# Patient Record
Sex: Female | Born: 1970 | Race: Black or African American | Hispanic: No | Marital: Married | Smoking: Former smoker
Health system: Southern US, Community
[De-identification: ages and names within clinical notes are randomized; demographics above are authoritative.]

## PROBLEM LIST (undated history)

## (undated) DIAGNOSIS — D649 Anemia, unspecified: Secondary | ICD-10-CM

## (undated) DIAGNOSIS — Z5189 Encounter for other specified aftercare: Secondary | ICD-10-CM

## (undated) DIAGNOSIS — K219 Gastro-esophageal reflux disease without esophagitis: Secondary | ICD-10-CM

## (undated) DIAGNOSIS — E785 Hyperlipidemia, unspecified: Secondary | ICD-10-CM

## (undated) DIAGNOSIS — E079 Disorder of thyroid, unspecified: Secondary | ICD-10-CM

## (undated) DIAGNOSIS — E119 Type 2 diabetes mellitus without complications: Secondary | ICD-10-CM

## (undated) DIAGNOSIS — T7840XA Allergy, unspecified, initial encounter: Secondary | ICD-10-CM

## (undated) DIAGNOSIS — J45909 Unspecified asthma, uncomplicated: Secondary | ICD-10-CM

## (undated) DIAGNOSIS — N189 Chronic kidney disease, unspecified: Secondary | ICD-10-CM

## (undated) HISTORY — PX: GALLBLADDER SURGERY: SHX652

## (undated) HISTORY — DX: Encounter for other specified aftercare: Z51.89

## (undated) HISTORY — DX: Disorder of thyroid, unspecified: E07.9

## (undated) HISTORY — DX: Unspecified asthma, uncomplicated: J45.909

## (undated) HISTORY — DX: Gastro-esophageal reflux disease without esophagitis: K21.9

## (undated) HISTORY — PX: HERNIA REPAIR: SHX51

## (undated) HISTORY — DX: Allergy, unspecified, initial encounter: T78.40XA

## (undated) HISTORY — DX: Hyperlipidemia, unspecified: E78.5

## (undated) HISTORY — DX: Chronic kidney disease, unspecified: N18.9

## (undated) HISTORY — PX: FRACTURE SURGERY: SHX138

## (undated) HISTORY — PX: ABDOMINAL HYSTERECTOMY: SHX81

## (undated) HISTORY — DX: Anemia, unspecified: D64.9

## (undated) HISTORY — DX: Type 2 diabetes mellitus without complications: E11.9

---

## 2018-09-27 ENCOUNTER — Ambulatory Visit: Payer: Self-pay

## 2018-09-27 NOTE — Telephone Encounter (Signed)
Patient called in with c/o "rectal bleeding, abdominal pain." She says "I have a history of polyps and I missed my last Colonoscopy because of my job. I had blood mixed in my stool and dripped in the water a couple of days ago, mixed with small blood clots. It wasn't a lot, maybe 50 cc's. I have constipation, sometimes going every 3-4 day and as long as 6 days." I asked about other symptoms, she says "abdominal pain since the blood and I was dizzy 4-5 days ago before the bleeding started." According to protocol, see PCP within 3 days, no availability with PCP, appointment scheduled for Thursday, 09/30/18 at 1020 with Dr. Alvy BimlerSagardia, care advice given, patient verbalized understanding.  Reason for Disposition . MILD rectal bleeding (more than just a few drops or streaks)  Answer Assessment - Initial Assessment Questions 1. APPEARANCE of BLOOD: "What color is it?" "Is it passed separately, on the surface of the stool, or mixed in with the stool?"      Mixed in the stool and dripped in the toilet, bright red 2. AMOUNT: "How much blood was passed?"      Small clots, about 50 cc's including the clots 3. FREQUENCY: "How many times has blood been passed with the stools?"      Once a couple of days ago 4. ONSET: "When was the blood first seen in the stools?" (Days or weeks)      A couple of days ago 5. DIARRHEA: "Is there also some diarrhea?" If so, ask: "How many diarrhea stools were passed in past 24 hours?"      No 6. CONSTIPATION: "Do you have constipation?" If so, "How bad is it?"     Yes; go every 3-4 days, as long as 6 days.  7. RECURRENT SYMPTOMS: "Have you had blood in your stools before?" If so, ask: "When was the last time?" and "What happened that time?"      Yes when I was 47 years old; found polyps and adenoma. Since then, a few polyps are found with the colonoscopy 8. BLOOD THINNERS: "Do you take any blood thinners?" (e.g., Coumadin/warfarin, Pradaxa/dabigatran, aspirin)     No 9. OTHER  SYMPTOMS: "Do you have any other symptoms?"  (e.g., abdominal pain, vomiting, dizziness, fever)     Abdominal pain, dizziness 4-5 days ago before the bleeding started 10. PREGNANCY: "Is there any chance you are pregnant?" "When was your last menstrual period?"       No; hysterectomy  Protocols used: RECTAL BLEEDING-A-AH

## 2018-09-30 ENCOUNTER — Ambulatory Visit (INDEPENDENT_AMBULATORY_CARE_PROVIDER_SITE_OTHER): Payer: Managed Care, Other (non HMO) | Admitting: Emergency Medicine

## 2018-09-30 ENCOUNTER — Encounter: Payer: Self-pay | Admitting: Emergency Medicine

## 2018-09-30 VITALS — BP 132/82 | HR 77 | Temp 98.1°F | Resp 16 | Ht 60.0 in | Wt 189.0 lb

## 2018-09-30 DIAGNOSIS — Z86010 Personal history of colonic polyps: Secondary | ICD-10-CM

## 2018-09-30 DIAGNOSIS — K625 Hemorrhage of anus and rectum: Secondary | ICD-10-CM

## 2018-09-30 LAB — POCT CBC
GRANULOCYTE PERCENT: 63.5 % (ref 37–80)
HCT, POC: 39.1 % (ref 29–41)
Hemoglobin: 13.4 g/dL (ref 11–14.6)
Lymph, poc: 3 (ref 0.6–3.4)
MCH, POC: 27.9 pg (ref 27–31.2)
MCHC: 34.2 g/dL (ref 31.8–35.4)
MCV: 81.5 fL (ref 76–111)
MID (cbc): 0.8 (ref 0–0.9)
MPV: 7.6 fL (ref 0–99.8)
POC Granulocyte: 6.7 (ref 2–6.9)
POC LYMPH PERCENT: 29 %L (ref 10–50)
POC MID %: 7.5 %M (ref 0–12)
Platelet Count, POC: 355 10*3/uL (ref 142–424)
RBC: 4.8 M/uL (ref 4.04–5.48)
RDW, POC: 13 %
WBC: 10.5 10*3/uL — AB (ref 4.6–10.2)

## 2018-09-30 NOTE — Assessment & Plan Note (Signed)
Clinically stable.  Normal vital signs with no orthostatic changes.  Normal CBC with normal hemoglobin.  Chronic history of intermittent rectal bleeding with a history of colonic polyps.  Needs GI evaluation with possible colonoscopy.  Advised to go to the emergency room if symptoms worsen in the next several days.

## 2018-09-30 NOTE — Patient Instructions (Addendum)
     If you have lab work done today you will be contacted with your lab results within the next 2 weeks.  If you have not heard from us then please contact us. The fastest way to get your results is to register for My Chart.   IF you received an x-ray today, you will receive an invoice from [facility]. Please contact [facility] at [phone] with questions or concerns regarding your invoice.   IF you received labwork today, you will receive an invoice from [facility]. Please contact [facility] at [phone] with questions or concerns regarding your invoice.   Our billing staff will not be able to assist you with questions regarding bills from these companies.  You will be contacted with the lab results as soon as they are available. The fastest way to get your results is to activate your My Chart account. Instructions are located on the last page of this paperwork. If you have not heard from us regarding the results in 2 weeks, please contact this office.     Rectal Bleeding  Rectal bleeding is when blood comes out of the opening of the butt (anus). People with this kind of bleeding may notice bright red blood in their underwear or in the toilet after they poop (have a bowel movement). They may also have dark red or black poop (stool). Rectal bleeding is often a sign that something is wrong. It needs to be checked by a doctor. Follow these instructions at home: Watch for any changes in your condition. Take these actions to help with bleeding and discomfort:  Eat a diet that is high in fiber. This will keep your poop soft so it is easier for you to poop without pushing too hard. Ask your doctor to tell you what foods and drinks are high in fiber.  Drink enough fluid to keep your pee (urine) clear or pale yellow. This also helps keep your poop soft.  Try taking a warm bath. This may help with pain.  Keep all follow-up visits as told by your doctor. This is  important. Get help right away if:  You have new bleeding.  You have more bleeding than before.  You have black or dark red poop.  You throw up (vomit) blood or something that looks like coffee grounds.  You have pain or tenderness in your belly (abdomen).  You have a fever.  You feel weak.  You feel sick to your stomach (nauseous).  You pass out (faint).  You have very bad pain in your butt.  You cannot poop. This information is not intended to replace advice given to you by your health care provider. Make sure you discuss any questions you have with your health care provider. Document Released: 06/11/2011 Document Revised: 03/06/2016 Document Reviewed: 11/25/2015 Elsevier Interactive Patient Education  2019 Elsevier Inc.  

## 2018-09-30 NOTE — Progress Notes (Signed)
Kelly Ellis 47 y.o.   Chief Complaint  Patient presents with  . Blood In Stools    x 3 days/ hx of polyps  . Nausea    x 3 days    HISTORY OF PRESENT ILLNESS: This is a 47 y.o. female with history of colonic polyps and chronic intermittent lower GI bleed complaining of typical rectal bleeding with every bowel movement for the past 5 days.  No syncopal episodes but had one dizzy spell yesterday.  Some diffuse lower abdominal pain.  Mild nausea but no vomiting.  Started having regular colonoscopies at age 82 when her polyps were first discovered.  One polyp was precancerous.  Last colonoscopy 7 years ago.  No other significant symptoms.  HPI   Prior to Admission medications   Medication Sig Start Date End Date Taking? Authorizing Provider  amitriptyline (ELAVIL) 25 MG tablet Take 25 mg by mouth at bedtime.   Yes [provider]  Butalbital-APAP-Caffeine (FIORICET PO) Take by mouth as needed.   Yes [provider]  cetirizine (ZYRTEC) 10 MG tablet Take 10 mg by mouth daily.   Yes [provider]  estradiol-levonorgestrel [facility]) 0.045-0.015 MG/DAY Place 1 patch onto the skin once a week.   Yes [provider]  famotidine (PEPCID) 20 MG tablet Take 20 mg by mouth 2 (two) times daily.   Yes [provider]  fexofenadine (ALLEGRA ALLERGY) 180 MG tablet Take 180 mg by mouth daily.   Yes [provider]  fluticasone furoate-vilanterol (BREO ELLIPTA) 100-25 MCG/INH AEPB Inhale 1 puff into the lungs daily.   Yes [provider]  montelukast (SINGULAIR) 10 MG tablet Take 10 mg by mouth at bedtime.   Yes [provider]  omeprazole (PRILOSEC) 20 MG capsule Take 20 mg by mouth daily.   Yes [provider]  umeclidinium bromide (INCRUSE ELLIPTA) 62.5 MCG/INH AEPB Inhale 1 puff into the lungs daily.   Yes [provider]    Allergies not on file  There are no active problems to display for this  patient.   No past medical history on file.    Social History   Socioeconomic History  . Marital status: Married    Spouse name: Not on file  . Number of children: Not on file  . Years of education: Not on file  . Highest education level: Not on file  Occupational History  . Not on file  Social Needs  . Financial resource strain: Not on file  . Food insecurity:    Worry: Not on file    Inability: Not on file  . Transportation needs:    Medical: Not on file    Non-medical: Not on file  Tobacco Use  . Smoking status: Not on file  Substance and Sexual Activity  . Alcohol use: Not on file  . Drug use: Not on file  . Sexual activity: Not on file  Lifestyle  . Physical activity:    Days per week: Not on file    Minutes per session: Not on file  . Stress: Not on file  Relationships  . Social connections:    Talks on phone: Not on file    Gets together: Not on file    Attends religious service: Not on file    Active member of club or organization: Not on file    Attends meetings of clubs or organizations: Not on file    Relationship status: Not on file  . Intimate partner violence:  Fear of current or ex partner: Not on file    Emotionally abused: Not on file    Physically abused: Not on file    Forced sexual activity: Not on file  Other Topics Concern  . Not on file  Social History Narrative  . Not on file    No family history on file.   Review of Systems  Constitutional: Negative.  Negative for chills, fever and malaise/fatigue.  HENT: Negative.   Eyes: Negative.   Respiratory: Negative.  Negative for cough and shortness of breath.   Cardiovascular: Negative.  Negative for chest pain and palpitations.  Gastrointestinal: Positive for abdominal pain, blood in stool and nausea. Negative for diarrhea, melena and vomiting.  Genitourinary: Negative.   Musculoskeletal: Negative.  Negative for back pain, myalgias and neck pain.  Skin: Negative.  Negative for  rash.  Neurological: Negative.  Negative for dizziness and headaches.  Endo/Heme/Allergies: Negative.   All other systems reviewed and are negative.   Vitals:   09/30/18 1119  BP: 132/82  Pulse: 77  Resp: 16  Temp: 98.1 F (36.7 C)  SpO2: 97%    Physical Exam Vitals signs reviewed.  Constitutional:      Appearance: Normal appearance.  HENT:     Head: Normocephalic and atraumatic.     Mouth/Throat:     Mouth: Mucous membranes are moist.     Pharynx: Oropharynx is clear.  Eyes:     Extraocular Movements: Extraocular movements intact.     Conjunctiva/sclera: Conjunctivae normal.     Pupils: Pupils are equal, round, and reactive to light.  Neck:     Musculoskeletal: Normal range of motion.  Cardiovascular:     Rate and Rhythm: Normal rate and regular rhythm.     Pulses: Normal pulses.     Heart sounds: Normal heart sounds.  Pulmonary:     Effort: Pulmonary effort is normal. No respiratory distress.     Breath sounds: Normal breath sounds.  Abdominal:     General: Abdomen is flat. Bowel sounds are normal. There is no distension.     Palpations: Abdomen is soft.     Tenderness: There is abdominal tenderness in the right lower quadrant, periumbilical area and left lower quadrant.  Musculoskeletal: Normal range of motion.  Skin:    General: Skin is warm and dry.     Capillary Refill: Capillary refill takes less than 2 seconds.  Neurological:     General: No focal deficit present.     Mental Status: She is alert and oriented to person, place, and time.  Psychiatric:        Mood and Affect: Mood normal.        Behavior: Behavior normal.     Orthostatic VS for the past 24 hrs:  BP- Lying Pulse- Lying BP- Sitting Pulse- Sitting BP- Standing at 0 minutes Pulse- Standing at 0 minutes  09/30/18 1152 138/90 68 138/90 74 133/90 74    Results for orders placed or performed in visit on 09/30/18 (from the past 24 hour(s))  POCT CBC     Status: Abnormal   Collection Time:  09/30/18 12:06 PM  Result Value Ref Range   WBC 10.5 (A) 4.6 - 10.2 K/uL   Lymph, poc 3.0 0.6 - 3.4   POC LYMPH PERCENT 29.0 10 - 50 %L   MID (cbc) 0.8 0 - 0.9   POC MID % 7.5 0 - 12 %M   POC Granulocyte 6.7 2 - 6.9   Granulocyte percent  63.5 37 - 80 %G   RBC 4.80 4.04 - 5.48 M/uL   Hemoglobin 13.4 11 - 14.6 g/dL   HCT, POC 16.1 29 - 41 %   MCV 81.5 76 - 111 fL   MCH, POC 27.9 27 - 31.2 pg   MCHC 34.2 31.8 - 35.4 g/dL   RDW, POC 09.6 %   Platelet Count, POC 355 142 - 424 K/uL   MPV 7.6 0 - 99.8 fL   A total of 40 minutes was spent in the room with the patient, greater than 50% of which was in counseling/coordination of care regarding differential diagnosis, treatment, warning symptoms, need for GI follow-up.  ASSESSMENT & PLAN: Rectal bleeding Clinically stable.  Normal vital signs with no orthostatic changes.  Normal CBC with normal hemoglobin.  Chronic history of intermittent rectal bleeding with a history of colonic polyps.  Needs GI evaluation with possible colonoscopy.  Advised to go to the emergency room if symptoms worsen in the next several days.  Torrey was seen today for blood in stools and nausea.  Diagnoses and all orders for this visit:  Rectal bleeding -     Orthostatic vital signs -     POCT CBC -     Comprehensive metabolic panel -     Ambulatory referral to Gastroenterology  History of colonic polyps -     Ambulatory referral to Gastroenterology    Patient Instructions       If you have lab work done today you will be contacted with your lab results within the next 2 weeks.  If you have not heard from [place] then please contact us. The fastest way to get your results is to register for My Chart.   IF you received an x-ray today, you will receive an invoice from [facility]. Please contact [facility] at [phone] with questions or concerns regarding your invoice.   IF you received labwork today, you will receive an invoice from  [facility]. Please contact [facility] at [phone] with questions or concerns regarding your invoice.   Our billing staff will not be able to assist you with questions regarding bills from these companies.  You will be contacted with the lab results as soon as they are available. The fastest way to get your results is to activate your My Chart account. Instructions are located on the last page of this paperwork. If you have not heard from [place] regarding the results in 2 weeks, please contact this office.      Rectal Bleeding  Rectal bleeding is when blood comes out of the opening of the butt (anus). People with this kind of bleeding may notice bright red blood in their underwear or in the toilet after they poop (have a bowel movement). They may also have dark red or black poop (stool). Rectal bleeding is often a sign that something is wrong. It needs to be checked by a doctor. Follow these instructions at home: Watch for any changes in your condition. Take these actions to help with bleeding and discomfort:  Eat a diet that is high in fiber. This will keep your poop soft so it is easier for you to poop without pushing too hard. Ask your doctor to tell you what foods and drinks are high in fiber.  Drink enough fluid to keep your pee (urine) clear or pale yellow. This also helps keep your poop soft.  Try taking a warm bath. This may help with pain.  Keep all follow-up visits  as told by your doctor. This is important. Get help right away if:  You have new bleeding.  You have more bleeding than before.  You have black or dark red poop.  You throw up (vomit) blood or something that looks like coffee grounds.  You have pain or tenderness in your belly (abdomen).  You have a fever.  You feel weak.  You feel sick to your stomach (nauseous).  You pass out (faint).  You have very bad pain in your butt.  You cannot poop. This information is not intended to replace advice given to you  by your health care provider. Make sure you discuss any questions you have with your health care provider. Document Released: 06/11/2011 Document Revised: 03/06/2016 Document Reviewed: 11/25/2015 Elsevier Interactive Patient Education  2019 Elsevier Inc.      Edwina BarthMiguel Connery Shiffler, MD [facility]

## 2018-10-01 LAB — COMPREHENSIVE METABOLIC PANEL
ALK PHOS: 83 IU/L (ref 39–117)
ALT: 46 IU/L — ABNORMAL HIGH (ref 0–32)
AST: 31 IU/L (ref 0–40)
Albumin/Globulin Ratio: 1.4 (ref 1.2–2.2)
Albumin: 4.4 g/dL (ref 3.5–5.5)
BUN/Creatinine Ratio: 11 (ref 9–23)
BUN: 9 mg/dL (ref 6–24)
Bilirubin Total: 0.5 mg/dL (ref 0.0–1.2)
CO2: 23 mmol/L (ref 20–29)
Calcium: 10.4 mg/dL — ABNORMAL HIGH (ref 8.7–10.2)
Chloride: 103 mmol/L (ref 96–106)
Creatinine, Ser: 0.85 mg/dL (ref 0.57–1.00)
GFR calc Af Amer: 96 mL/min/{1.73_m2} (ref >59)
GFR calc non Af Amer: 83 mL/min/{1.73_m2} (ref >59)
GLOBULIN, TOTAL: 3.2 g/dL (ref 1.5–4.5)
Glucose: 93 mg/dL (ref 65–99)
POTASSIUM: 3.9 mmol/L (ref 3.5–5.2)
SODIUM: 143 mmol/L (ref 134–144)
Total Protein: 7.6 g/dL (ref 6.0–8.5)

## 2018-10-16 ENCOUNTER — Other Ambulatory Visit: Payer: Self-pay

## 2018-10-16 ENCOUNTER — Ambulatory Visit (HOSPITAL_COMMUNITY)
Admission: EM | Admit: 2018-10-16 | Discharge: 2018-10-16 | Disposition: A | Discharge status: Home / Self Care | Payer: Managed Care, Other (non HMO) | Attending: Family Medicine | Admitting: Family Medicine

## 2018-10-16 ENCOUNTER — Encounter (HOSPITAL_COMMUNITY): Payer: Self-pay | Admitting: *Deleted

## 2018-10-16 DIAGNOSIS — H02842 Edema of right lower eyelid: Secondary | ICD-10-CM | POA: Insufficient documentation

## 2018-10-16 DIAGNOSIS — H00012 Hordeolum externum right lower eyelid: Secondary | ICD-10-CM | POA: Insufficient documentation

## 2018-10-16 MED ORDER — TOBRAMYCIN 0.3 % OP SOLN: 1.0000 [drp] | Freq: Four times a day (QID) | OPHTHALMIC | 0 refills | Status: AC

## 2018-10-16 NOTE — ED Provider Notes (Signed)
[facility]   [id number] 10/16/18 Arrival Time: 1029  ASSESSMENT & PLAN:  1. Swelling of right lower eyelid   2. Hordeolum externum of right lower eyelid     Meds ordered this encounter  Medications  . tobramycin (TOBREX) 0.3 % ophthalmic solution    Sig: Place 1 drop into the right eye every 6 (six) hours.    Dispense:  5 mL    Refill:  0    Ophthalmic drops per orders. Warm compress to eye(s). Expect gradual resolution over the next few days.  Follow-up Information    Myles Lipps, MD.   Specialty:  Family Medicine Why:  As needed. Contact information: [address] [place] [address] [phone]          Reviewed expectations re: course of current medical issues. Questions answered. Outlined signs and symptoms indicating need for more acute intervention. Patient verbalized understanding. After Visit Summary given.   SUBJECTIVE:  Kelly Ellis is a 48 y.o. female who presents with complaint of right lower eyelid swelling. Onset noticed when waking two days ago; stable. No eye pain or itching reported. No drainage. No recent illnesses. Afebrile. Injury: no. Visual changes: no. Contact lens use: no. Self treatment: none. No h/o similar. No new exposures or medicines.  ROS: As per HPI.  OBJECTIVE:  Vitals:   10/16/18 1127  BP: (!) 144/95  Pulse: 67  Resp: 14  Temp: 98.1 F (36.7 C)  TempSrc: Oral  SpO2: 99%    General appearance: alert; no distress Eyes: swelling of right lower eyelid with what appears to be an early stye forming; no drainage; conjunctivae normal; PERRLA; EOMI with pain; no evidence of periorbital cellulitis Neck: supple with LAD Lungs: unlabored respirations Skin: warm and dry Psychological: alert and cooperative; normal mood and affect  Allergies  Allergen Reactions  . Contrast Media [Iodinated Diagnostic Agents] Anaphylaxis  . Erythromycin Nausea And Vomiting  . Reglan [Metoclopramide] Other (See  Comments)    Past Medical History:  Diagnosis Date  . Allergy   . Anemia   . Asthma   . Blood transfusion without reported diagnosis   . Chronic kidney disease   . Diabetes mellitus without complication (HCC)   . GERD (gastroesophageal reflux disease)   . Hyperlipidemia   . Thyroid disease    Social History   Socioeconomic History  . Marital status: Married    Spouse name: Not on file  . Number of children: Not on file  . Years of education: Not on file  . Highest education level: Not on file  Occupational History  . Not on file  Social Needs  . Financial resource strain: Not on file  . Food insecurity:    Worry: Not on file    Inability: Not on file  . Transportation needs:    Medical: Not on file    Non-medical: Not on file  Tobacco Use  . Smoking status: Former Games developer  . Smokeless tobacco: Never Used  Substance and Sexual Activity  . Alcohol use: Not Currently  . Drug use: Not Currently  . Sexual activity: Not on file  Lifestyle  . Physical activity:    Days per week: Not on file    Minutes per session: Not on file  . Stress: Not on file  Relationships  . Social connections:    Talks on phone: Not on file    Gets together: Not on file    Attends religious service: Not on file  Active member of club or organization: Not on file    Attends meetings of clubs or organizations: Not on file    Relationship status: Not on file  . Intimate partner violence:    Fear of current or ex partner: Not on file    Emotionally abused: Not on file    Physically abused: Not on file    Forced sexual activity: Not on file  Other Topics Concern  . Not on file  Social History Narrative  . Not on file   Family History  Problem Relation Age of Onset  . Heart disease Mother   . Hyperlipidemia Mother   . Cancer Father   . Diabetes Father   . Heart disease Father   . Hyperlipidemia Father   . Stroke Father    Past Surgical History:  Procedure Laterality Date  .  ABDOMINAL HYSTERECTOMY    . FRACTURE SURGERY    . GALLBLADDER SURGERY    . HERNIA REPAIR       Mardella Layman, MD 10/16/18 1210

## 2018-10-16 NOTE — ED Triage Notes (Signed)
C/o swelling to right eye onset 2 days ago.

## 2018-11-03 ENCOUNTER — Emergency Department (HOSPITAL_COMMUNITY): Payer: Managed Care, Other (non HMO)

## 2018-11-03 ENCOUNTER — Emergency Department (HOSPITAL_COMMUNITY)
Admission: EM | Admit: 2018-11-03 | Discharge: 2018-11-03 | Disposition: A | Discharge status: Home / Self Care | Payer: Managed Care, Other (non HMO) | Attending: Emergency Medicine | Admitting: Emergency Medicine

## 2018-11-03 ENCOUNTER — Other Ambulatory Visit: Payer: Self-pay

## 2018-11-03 DIAGNOSIS — J029 Acute pharyngitis, unspecified: Secondary | ICD-10-CM | POA: Diagnosis present

## 2018-11-03 DIAGNOSIS — Z87891 Personal history of nicotine dependence: Secondary | ICD-10-CM | POA: Insufficient documentation

## 2018-11-03 DIAGNOSIS — E119 Type 2 diabetes mellitus without complications: Secondary | ICD-10-CM | POA: Diagnosis not present

## 2018-11-03 DIAGNOSIS — Z79899 Other long term (current) drug therapy: Secondary | ICD-10-CM | POA: Insufficient documentation

## 2018-11-03 DIAGNOSIS — B9789 Other viral agents as the cause of diseases classified elsewhere: Secondary | ICD-10-CM

## 2018-11-03 DIAGNOSIS — N189 Chronic kidney disease, unspecified: Secondary | ICD-10-CM | POA: Diagnosis not present

## 2018-11-03 DIAGNOSIS — E079 Disorder of thyroid, unspecified: Secondary | ICD-10-CM | POA: Insufficient documentation

## 2018-11-03 DIAGNOSIS — J45909 Unspecified asthma, uncomplicated: Secondary | ICD-10-CM | POA: Diagnosis not present

## 2018-11-03 DIAGNOSIS — J069 Acute upper respiratory infection, unspecified: Secondary | ICD-10-CM | POA: Diagnosis not present

## 2018-11-03 MED ORDER — BENZONATATE 100 MG PO CAPS: 100.0000 mg | ORAL_CAPSULE | Freq: Three times a day (TID) | ORAL | 0 refills | Status: DC

## 2018-11-03 MED ORDER — IPRATROPIUM-ALBUTEROL 0.5-2.5 (3) MG/3ML IN SOLN
3.0000 mL | Freq: Once | RESPIRATORY_TRACT | Status: AC
  Administered 2018-11-03: 3 mL via RESPIRATORY_TRACT
  Filled 2018-11-03: qty 3

## 2018-11-03 NOTE — Discharge Instructions (Addendum)
Please read attached information. If you experience any new or worsening signs or symptoms please return to the emergency room for evaluation. Please follow-up with your primary care provider or specialist as discussed. Please use medication prescribed only as directed and discontinue taking if you have any concerning signs or symptoms.   °

## 2018-11-03 NOTE — ED Triage Notes (Signed)
Pt having flu like symptoms x 2 days. Congestion cough, chills

## 2018-11-03 NOTE — ED Provider Notes (Signed)
[facility] EMERGENCY DEPARTMENT Provider Note   CSN: [id number] Arrival date & time: 11/03/18  3748     History   Chief Complaint Chief Complaint  Patient presents with  . Influenza    HPI Kelly Ellis is a 48 y.o. female.  HPI   48 year old female with a past medical history of CKD, borderline diabetes, hyperlipidemia, asthma presents today with complaints of respiratory infection.  Patient notes symptoms started 3 days ago with sore throat then developed into nasal congestion rhinorrhea and cough.  Patient notes pain with coughing and minor shortness of breath presently.  She denies any objective fevers but notes she feels hot at night.  She reports that her husband is sick with similar symptoms.  She took ibuprofen approximately 3 hours prior to arrival.  Past Medical History:  Diagnosis Date  . Allergy   . Anemia   . Asthma   . Blood transfusion without reported diagnosis   . Chronic kidney disease   . Diabetes mellitus without complication (HCC)   . GERD (gastroesophageal reflux disease)   . Hyperlipidemia   . Thyroid disease     Patient Active Problem List   Diagnosis Date Noted  . Rectal bleeding 09/30/2018  . History of colonic polyps 09/30/2018    Past Surgical History:  Procedure Laterality Date  . ABDOMINAL HYSTERECTOMY    . FRACTURE SURGERY    . GALLBLADDER SURGERY    . HERNIA REPAIR       OB History   No obstetric history on file.      Home Medications    Prior to Admission medications   Medication Sig Start Date End Date Taking? Authorizing Provider  amitriptyline (ELAVIL) 25 MG tablet Take 25 mg by mouth at bedtime.    [provider]  benzonatate (TESSALON) 100 MG capsule Take 1 capsule (100 mg total) by mouth every 8 (eight) hours. 11/03/18   Kalla Watson, Tinnie Gens, PA-C  Butalbital-APAP-Caffeine (FIORICET PO) Take by mouth as needed.    [provider]  cetirizine (ZYRTEC) 10 MG tablet Take 10 mg by mouth  daily.    [provider]  estradiol-levonorgestrel [facility]) 0.045-0.015 MG/DAY Place 1 patch onto the skin once a week.    [provider]  famotidine (PEPCID) 20 MG tablet Take 20 mg by mouth 2 (two) times daily.    [provider]  fexofenadine (ALLEGRA ALLERGY) 180 MG tablet Take 180 mg by mouth daily.    [provider]  fluticasone furoate-vilanterol (BREO ELLIPTA) 100-25 MCG/INH AEPB Inhale 1 puff into the lungs daily.    [provider]  montelukast (SINGULAIR) 10 MG tablet Take 10 mg by mouth at bedtime.    [provider]  omeprazole (PRILOSEC) 20 MG capsule Take 20 mg by mouth daily.    [provider]  tobramycin (TOBREX) 0.3 % ophthalmic solution Place 1 drop into the right eye every 6 (six) hours. 10/16/18   Mardella Layman, MD  umeclidinium bromide (INCRUSE ELLIPTA) 62.5 MCG/INH AEPB Inhale 1 puff into the lungs daily.    [provider]    Family History Family History  Problem Relation Age of Onset  . Heart disease Mother   . Hyperlipidemia Mother   . Cancer Father   . Diabetes Father   . Heart disease Father   . Hyperlipidemia Father   . Stroke Father     Social History Social History   Tobacco Use  . Smoking status: Former Games developer  . Smokeless  tobacco: Never Used  Substance Use Topics  . Alcohol use: Not Currently  . Drug use: Not Currently     Allergies   Contrast media [iodinated diagnostic agents]; Erythromycin; and Reglan [metoclopramide]   Review of Systems Review of Systems  All other systems reviewed and are negative.    Physical Exam Updated Vital Signs BP (!) 137/97 (BP Location: Right Arm)   Pulse 71   Temp 98.3 F (36.8 C) (Oral)   Resp 20   Ht 4\' 11"  (1.499 m)   Wt 82.6 kg   SpO2 100%   BMI 36.76 kg/m   Physical Exam Vitals signs and nursing note reviewed.  Constitutional:      Appearance: She is well-developed.  HENT:     Head: Normocephalic and  atraumatic.  Eyes:     General: No scleral icterus.       Right eye: No discharge.        Left eye: No discharge.     Conjunctiva/sclera: Conjunctivae normal.     Pupils: Pupils are equal, round, and reactive to light.  Neck:     Musculoskeletal: Normal range of motion.     Vascular: No JVD.     Trachea: No tracheal deviation.  Cardiovascular:     Rate and Rhythm: Normal rate and regular rhythm.  Pulmonary:     Effort: Pulmonary effort is normal. No respiratory distress.     Breath sounds: No stridor. No wheezing, rhonchi or rales.     Comments: Diminished sounds throughout-no respiratory distress Chest:     Chest wall: No tenderness.  Neurological:     Mental Status: She is alert and oriented to person, place, and time.     Coordination: Coordination normal.  Psychiatric:        Behavior: Behavior normal.        Thought Content: Thought content normal.        Judgment: Judgment normal.      ED Treatments / Results  Labs (all labs ordered are listed, but only abnormal results are displayed) Labs Reviewed - No data to display  EKG None  Radiology Dg Chest 2 View  Result Date: 11/03/2018 CLINICAL DATA:  Cough and fever EXAM: CHEST - 2 VIEW COMPARISON:  None. FINDINGS: Normal heart size and mediastinal contours. No acute infiltrate or edema. No effusion or pneumothorax. No acute osseous findings. Cholecystectomy clips. IMPRESSION: Negative for pneumonia. Electronically Signed   By: Marnee SpringJonathon  Watts M.D.   On: 11/03/2018 08:13    Procedures Procedures (including critical care time)  Medications Ordered in ED Medications  ipratropium-albuterol (DUONEB) 0.5-2.5 (3) MG/3ML nebulizer solution 3 mL (3 mLs Nebulization Given 11/03/18 0742)     Initial Impression / Assessment and Plan / ED Course  I have reviewed the triage vital signs and the nursing notes.  Pertinent labs & imaging results that were available during my care of the patient were reviewed by me and  considered in my medical decision making (see chart for details).    Labs:   Imaging: DG chest 2 view  Consults:  Therapeutics:  Discharge Meds:   Assessment/Plan: 48 year old female presents today with likely viral URI.  Low suspicion for influenza, low suspicion for bacterial infection.  Patient given discharge instructions strict return precautions.  She verbalized understanding and agreement to today's plan.    Final Clinical Impressions(s) / ED Diagnoses   Final diagnoses:  Viral URI with cough    ED Discharge Orders  Ordered    benzonatate (TESSALON) 100 MG capsule  Every 8 hours     11/03/18 0905           Eyvonne Mechanic, PA-C 11/03/18 0907    Melene Plan, DO 11/03/18 1558

## 2018-11-08 ENCOUNTER — Ambulatory Visit: Payer: Self-pay | Admitting: Family Medicine

## 2019-03-28 ENCOUNTER — Encounter (HOSPITAL_COMMUNITY): Payer: Self-pay

## 2019-03-28 ENCOUNTER — Ambulatory Visit (HOSPITAL_COMMUNITY)
Admission: EM | Admit: 2019-03-28 | Discharge: 2019-03-28 | Disposition: A | Discharge status: Home / Self Care | Payer: Managed Care, Other (non HMO) | Attending: Physician Assistant | Admitting: Physician Assistant

## 2019-03-28 ENCOUNTER — Other Ambulatory Visit: Payer: Self-pay

## 2019-03-28 DIAGNOSIS — B353 Tinea pedis: Secondary | ICD-10-CM | POA: Diagnosis not present

## 2019-03-28 DIAGNOSIS — L089 Local infection of the skin and subcutaneous tissue, unspecified: Secondary | ICD-10-CM | POA: Diagnosis not present

## 2019-03-28 LAB — GLUCOSE, CAPILLARY: Glucose-Capillary: 104 mg/dL — ABNORMAL HIGH (ref 70–99)

## 2019-03-28 MED ORDER — SULFAMETHOXAZOLE-TRIMETHOPRIM 800-160 MG PO TABS: 1.0000 | ORAL_TABLET | Freq: Two times a day (BID) | ORAL | 0 refills | Status: AC

## 2019-03-28 MED ORDER — TOLNAFTATE 1 % EX CREA: 1.0000 "application " | TOPICAL_CREAM | Freq: Two times a day (BID) | CUTANEOUS | 0 refills | Status: AC

## 2019-03-28 NOTE — ED Provider Notes (Signed)
[facility]    CSN: [id number] Arrival date & time: 03/28/19  0805      History   Chief Complaint Chief Complaint  Patient presents with   Foot Pain    HPI Kelly Ellis is a 48 y.o. female.   The history is provided by the patient. No language interpreter was used.  Foot Pain This is a new problem. The current episode started 2 days ago. The problem occurs constantly. The problem has been gradually worsening. Nothing aggravates the symptoms. Nothing relieves the symptoms. She has tried nothing for the symptoms. The treatment provided no relief.  Pt reports she developed a blister between her left 4th and 5th toe.  Pt reports now foot is becoming red and painful   Past Medical History:  Diagnosis Date   Allergy    Anemia    Asthma    Blood transfusion without reported diagnosis    Chronic kidney disease    Diabetes mellitus without complication (HCC)    GERD (gastroesophageal reflux disease)    Hyperlipidemia    Thyroid disease     Patient Active Problem List   Diagnosis Date Noted   Rectal bleeding 09/30/2018   History of colonic polyps 09/30/2018    Past Surgical History:  Procedure Laterality Date   ABDOMINAL HYSTERECTOMY     FRACTURE SURGERY     GALLBLADDER SURGERY     HERNIA REPAIR      OB History   No obstetric history on file.      Home Medications    Prior to Admission medications   Medication Sig Start Date End Date Taking? Authorizing Provider  amitriptyline (ELAVIL) 25 MG tablet Take 25 mg by mouth at bedtime.    [provider]  benzonatate (TESSALON) 100 MG capsule Take 1 capsule (100 mg total) by mouth every 8 (eight) hours. Patient not taking: Reported on 03/28/2019 11/03/18   Hedges, Tinnie GensJeffrey, PA-C  Butalbital-APAP-Caffeine (FIORICET PO) Take by mouth as needed.    [provider]  cetirizine (ZYRTEC) 10 MG tablet Take 10 mg by mouth daily.    [provider]  estradiol-levonorgestrel  [facility](CLIMARAPRO) 0.045-0.015 MG/DAY Place 1 patch onto the skin once a week.    [provider]  famotidine (PEPCID) 20 MG tablet Take 20 mg by mouth 2 (two) times daily.    [provider]  fexofenadine (ALLEGRA ALLERGY) 180 MG tablet Take 180 mg by mouth daily.    [provider]  fluticasone furoate-vilanterol (BREO ELLIPTA) 100-25 MCG/INH AEPB Inhale 1 puff into the lungs daily.    [provider]  montelukast (SINGULAIR) 10 MG tablet Take 10 mg by mouth at bedtime.    [provider]  omeprazole (PRILOSEC) 20 MG capsule Take 20 mg by mouth daily.    [provider]  tobramycin (TOBREX) 0.3 % ophthalmic solution Place 1 drop into the right eye every 6 (six) hours. 10/16/18   Mardella LaymanHagler, Brian, MD  umeclidinium bromide (INCRUSE ELLIPTA) 62.5 MCG/INH AEPB Inhale 1 puff into the lungs daily.    [provider]    Family History Family History  Problem Relation Age of Onset   Heart disease Mother    Hyperlipidemia Mother    Cancer Father    Diabetes Father    Heart disease Father    Hyperlipidemia Father    Stroke Father     Social History Social History   Tobacco Use   Smoking status: Former Smoker   Smokeless tobacco: Never  Used  Substance Use Topics   Alcohol use: Not Currently   Drug use: Not Currently     Allergies   Contrast media [iodinated diagnostic agents], Erythromycin, Flagyl [metronidazole], Morphine and related, and Reglan [metoclopramide]   Review of Systems Review of Systems  Skin: Positive for wound.  All other systems reviewed and are negative.    Physical Exam Triage Vital Signs ED Triage Vitals  Enc Vitals Group     BP 03/28/19 0827 (!) 135/93     Pulse Rate 03/28/19 0827 76     Resp 03/28/19 0827 18     Temp 03/28/19 0827 98.2 F (36.8 C)     Temp Source 03/28/19 0827 Oral     SpO2 03/28/19 0827 100 %     Weight 03/28/19 0823 176 lb (79.8 kg)     Height --      Head  Circumference --      Peak Flow --      Pain Score 03/28/19 0823 4     Pain Loc --      Pain Edu? --      Excl. in [place]? --    No data found.  Updated Vital Signs BP (!) 135/93 (BP Location: Right Arm)    Pulse 76    Temp 98.2 F (36.8 C) (Oral)    Resp 18    Wt 79.8 kg    SpO2 100%    BMI 35.55 kg/m   Visual Acuity Right Eye Distance:   Left Eye Distance:   Bilateral Distance:    Right Eye Near:   Left Eye Near:    Bilateral Near:     Physical Exam Vitals signs reviewed.  Constitutional:      Appearance: Normal appearance.  Musculoskeletal:        General: Swelling present.     Comments: Small white area crease between 4th and 5th toe   Skin:    General: Skin is warm.  Neurological:     General: No focal deficit present.     Mental Status: She is alert.  Psychiatric:        Mood and Affect: Mood normal.      UC Treatments / Results  Labs (all labs ordered are listed, but only abnormal results are displayed) Labs Reviewed  CBG MONITORING, ED    EKG None  Radiology No results found.  Procedures Procedures (including critical care time)  Medications Ordered in UC Medications - No data to display  Initial Impression / Assessment and Plan / UC Course  I have reviewed the triage vital signs and the nursing notes.  Pertinent labs & imaging results that were available during my care of the patient were reviewed by me and considered in my medical decision making (see chart for details).      Final Clinical Impressions(s) / UC Diagnoses   Final diagnoses:  Tinea pedis of left foot  Skin infection     Discharge Instructions     Soak foot 20 minutes 4 times a day.  Make sure feet stay dry at work     ED Prescriptions    None     Controlled Substance Prescriptions [place] Controlled Substance Registry consulted? Not Applicable  An After Visit Summary was printed and given to the patient.    Fransico Meadow, [place] 03/28/19 [phone]

## 2019-03-28 NOTE — Discharge Instructions (Addendum)
Soak foot 20 minutes 4 times a day.  Make sure feet stay dry at work

## 2019-03-28 NOTE — ED Triage Notes (Signed)
Pt states she has a blister between her toes on her left foot. Pt states it's very painful. It's been there 3 days

## 2019-03-29 ENCOUNTER — Other Ambulatory Visit: Payer: Self-pay

## 2019-03-29 ENCOUNTER — Encounter (HOSPITAL_COMMUNITY): Payer: Self-pay | Admitting: Emergency Medicine

## 2019-03-29 ENCOUNTER — Emergency Department (HOSPITAL_COMMUNITY)
Admission: EM | Admit: 2019-03-29 | Discharge: 2019-03-29 | Disposition: A | Discharge status: Home / Self Care | Payer: Managed Care, Other (non HMO) | Attending: Emergency Medicine | Admitting: Emergency Medicine

## 2019-03-29 DIAGNOSIS — E1122 Type 2 diabetes mellitus with diabetic chronic kidney disease: Secondary | ICD-10-CM | POA: Diagnosis not present

## 2019-03-29 DIAGNOSIS — N189 Chronic kidney disease, unspecified: Secondary | ICD-10-CM | POA: Insufficient documentation

## 2019-03-29 DIAGNOSIS — J45909 Unspecified asthma, uncomplicated: Secondary | ICD-10-CM | POA: Diagnosis not present

## 2019-03-29 DIAGNOSIS — Z79899 Other long term (current) drug therapy: Secondary | ICD-10-CM | POA: Diagnosis not present

## 2019-03-29 DIAGNOSIS — L03116 Cellulitis of left lower limb: Secondary | ICD-10-CM | POA: Diagnosis not present

## 2019-03-29 DIAGNOSIS — F17200 Nicotine dependence, unspecified, uncomplicated: Secondary | ICD-10-CM | POA: Diagnosis not present

## 2019-03-29 DIAGNOSIS — M79672 Pain in left foot: Secondary | ICD-10-CM | POA: Diagnosis present

## 2019-03-29 MED ORDER — CEPHALEXIN 500 MG PO CAPS: 500.0000 mg | ORAL_CAPSULE | Freq: Four times a day (QID) | ORAL | 0 refills | Status: AC

## 2019-03-29 MED ORDER — TRAMADOL HCL 50 MG PO TABS: 50.0000 mg | ORAL_TABLET | Freq: Four times a day (QID) | ORAL | 0 refills | Status: AC | PRN

## 2019-03-29 NOTE — ED Triage Notes (Signed)
Pt. Stated, I was diagnosied with cellulitis 2 days and it seems to bee worse. Giving Bactrim .

## 2019-03-29 NOTE — ED Provider Notes (Signed)
[facility] Provider Note   CSN: [id number] Arrival date & time: 03/29/19  1248   History   Chief Complaint Chief Complaint  Patient presents with  . Cellulitis  . Foot Pain    HPI Kelly Ellis is a 48 y.o. female.      HPI    48 year old female presents today with complaints of foot pain.  Patient notes that approxi-6 days ago she developed pain in her left foot.  She noted a blister between her left fourth and fifth digits.  She notes surrounding redness and pain that is progressed.  She was seen yesterday and started on Bactrim.  She notes she has taken 3 doses.  She notes the redness is worsening into the dorsum of the foot.  Denies any fever or systemic illnesses.  She is not diabetic.   Past Medical History:  Diagnosis Date  . Allergy   . Anemia   . Asthma   . Blood transfusion without reported diagnosis   . Chronic kidney disease   . Diabetes mellitus without complication (HCC)   . GERD (gastroesophageal reflux disease)   . Hyperlipidemia   . Thyroid disease     Patient Active Problem List   Diagnosis Date Noted  . Rectal bleeding 09/30/2018  . History of colonic polyps 09/30/2018    Past Surgical History:  Procedure Laterality Date  . ABDOMINAL HYSTERECTOMY    . FRACTURE SURGERY    . GALLBLADDER SURGERY    . HERNIA REPAIR       OB History   No obstetric history on file.     Home Medications    Prior to Admission medications   Medication Sig Start Date End Date Taking? Authorizing Provider  amitriptyline (ELAVIL) 25 MG tablet Take 25 mg by mouth at bedtime.    [provider]  Butalbital-APAP-Caffeine (FIORICET PO) Take by mouth as needed.    [provider]  cephALEXin (KEFLEX) 500 MG capsule Take 1 capsule (500 mg total) by mouth 4 (four) times daily. 03/29/19   Dyron Kawano, Tinnie GensJeffrey, PA-C  cetirizine (ZYRTEC) 10 MG tablet Take 10 mg by mouth daily.    [provider]   estradiol-levonorgestrel [facility](CLIMARAPRO) 0.045-0.015 MG/DAY Place 1 patch onto the skin once a week.    [provider]  famotidine (PEPCID) 20 MG tablet Take 20 mg by mouth 2 (two) times daily.    [provider]  fexofenadine (ALLEGRA ALLERGY) 180 MG tablet Take 180 mg by mouth daily.    [provider]  fluticasone furoate-vilanterol (BREO ELLIPTA) 100-25 MCG/INH AEPB Inhale 1 puff into the lungs daily.    [provider]  montelukast (SINGULAIR) 10 MG tablet Take 10 mg by mouth at bedtime.    [provider]  omeprazole (PRILOSEC) 20 MG capsule Take 20 mg by mouth daily.    [provider]  sulfamethoxazole-trimethoprim (BACTRIM DS) 800-160 MG tablet Take 1 tablet by mouth 2 (two) times daily for 7 days. 03/28/19 04/04/19  Elson AreasSofia, Leslie K, PA-C  tobramycin (TOBREX) 0.3 % ophthalmic solution Place 1 drop into the right eye every 6 (six) hours. 10/16/18   Mardella LaymanHagler, Brian, MD  tolnaftate (TINACTIN) 1 % cream Apply 1 application topically 2 (two) times daily. 03/28/19   Elson AreasSofia, Leslie K, PA-C  traMADol (ULTRAM) 50 MG tablet Take 1 tablet (50 mg total) by mouth every 6 (six) hours as needed. 03/29/19   Aldo Sondgeroth, Tinnie GensJeffrey, PA-C  umeclidinium bromide (INCRUSE ELLIPTA) 62.5 MCG/INH AEPB Inhale  1 puff into the lungs daily.    [provider]    Family History Family History  Problem Relation Age of Onset  . Heart disease Mother   . Hyperlipidemia Mother   . Cancer Father   . Diabetes Father   . Heart disease Father   . Hyperlipidemia Father   . Stroke Father     Social History Social History   Tobacco Use  . Smoking status: Former Research scientist (life sciences)  . Smokeless tobacco: Never Used  Substance Use Topics  . Alcohol use: Not Currently  . Drug use: Not Currently     Allergies   Contrast media [iodinated diagnostic agents], Erythromycin, Flagyl [metronidazole], Morphine and related, and Reglan [metoclopramide]   Review of Systems Review of Systems   All other systems reviewed and are negative.   Physical Exam Updated Vital Signs BP (!) 129/91   Pulse 89   Temp 98.5 F (36.9 C) (Oral)   Resp 20   SpO2 98%   Physical Exam Vitals signs and nursing note reviewed.  Constitutional:      Appearance: She is well-developed.  HENT:     Head: Normocephalic and atraumatic.  Eyes:     General: No scleral icterus.       Right eye: No discharge.        Left eye: No discharge.     Conjunctiva/sclera: Conjunctivae normal.     Pupils: Pupils are equal, round, and reactive to light.  Neck:     Musculoskeletal: Normal range of motion.     Vascular: No JVD.     Trachea: No tracheal deviation.  Pulmonary:     Effort: Pulmonary effort is normal.     Breath sounds: No stridor.  Musculoskeletal:     Comments: Minor swelling to the left dorsal foot, skin breakdown noted in the interdigit space between 4 and 5, no purulence, no fluctuance-redness extends to the mid dorsal foot  Neurological:     Mental Status: She is alert and oriented to person, place, and time.     Coordination: Coordination normal.  Psychiatric:        Behavior: Behavior normal.        Thought Content: Thought content normal.        Judgment: Judgment normal.     ED Treatments / Results  Labs (all labs ordered are listed, but only abnormal results are displayed) Labs Reviewed - No data to display  EKG None  Radiology No results found.  Procedures Procedures (including critical care time)  Medications Ordered in ED Medications - No data to display   Initial Impression / Assessment and Plan / ED Course  I have reviewed the triage vital signs and the nursing notes.  Pertinent labs & imaging results that were available during my care of the patient were reviewed by me and considered in my medical decision making (see chart for details).        48 year old female presents today with likely cellulitis.  She has no signs of drainable abscess or fluid  collection.  Patient was placed on Bactrim yesterday I will add Keflex to her treatment.  She is only taken 3 pills.  She will return immediately if she develops any new or worsening signs or symptoms or symptoms do not improve.  She verbalized understanding and agreement to today's plan and had no further questions or concerns at the time of discharge.  Final Clinical Impressions(s) / ED Diagnoses   Final diagnoses:  Cellulitis of left  lower extremity    ED Discharge Orders         Ordered    cephALEXin (KEFLEX) 500 MG capsule  4 times daily     03/29/19 1357    traMADol (ULTRAM) 50 MG tablet  Every 6 hours PRN     03/29/19 1357           Eyvonne MechanicHedges, Minervia Osso, PA-C 03/29/19 1526    Lorre NickAllen, Anthony, MD 03/30/19 [phone]

## 2019-03-29 NOTE — Discharge Instructions (Addendum)
Please read attached information. If you experience any new or worsening signs or symptoms please return to the emergency room for evaluation. Please follow-up with your primary care provider or specialist as discussed. Please use medication prescribed only as directed and discontinue taking if you have any concerning signs or symptoms.   °

## 2019-06-08 ENCOUNTER — Emergency Department: Payer: Managed Care, Other (non HMO)

## 2019-06-08 ENCOUNTER — Encounter: Payer: Self-pay | Admitting: Emergency Medicine

## 2019-06-08 ENCOUNTER — Other Ambulatory Visit: Payer: Self-pay

## 2019-06-08 ENCOUNTER — Emergency Department
Admission: EM | Admit: 2019-06-08 | Discharge: 2019-06-08 | Disposition: A | Discharge status: Home / Self Care | Payer: Managed Care, Other (non HMO) | Attending: Emergency Medicine | Admitting: Emergency Medicine

## 2019-06-08 DIAGNOSIS — R1084 Generalized abdominal pain: Secondary | ICD-10-CM | POA: Diagnosis present

## 2019-06-08 DIAGNOSIS — J45909 Unspecified asthma, uncomplicated: Secondary | ICD-10-CM | POA: Insufficient documentation

## 2019-06-08 DIAGNOSIS — N189 Chronic kidney disease, unspecified: Secondary | ICD-10-CM | POA: Diagnosis not present

## 2019-06-08 DIAGNOSIS — E1122 Type 2 diabetes mellitus with diabetic chronic kidney disease: Secondary | ICD-10-CM | POA: Diagnosis not present

## 2019-06-08 DIAGNOSIS — Z87891 Personal history of nicotine dependence: Secondary | ICD-10-CM | POA: Insufficient documentation

## 2019-06-08 LAB — CBC
HCT: 44.1 % (ref 36.0–46.0)
Hemoglobin: 14.2 g/dL (ref 12.0–15.0)
MCH: 26.7 pg (ref 26.0–34.0)
MCHC: 32.2 g/dL (ref 30.0–36.0)
MCV: 83.1 fL (ref 80.0–100.0)
Platelets: 368 10*3/uL (ref 150–400)
RBC: 5.31 MIL/uL — ABNORMAL HIGH (ref 3.87–5.11)
RDW: 12.4 % (ref 11.5–15.5)
WBC: 7.9 10*3/uL (ref 4.0–10.5)
nRBC: 0 % (ref 0.0–0.2)

## 2019-06-08 LAB — COMPREHENSIVE METABOLIC PANEL
ALT: 49 U/L — ABNORMAL HIGH (ref 0–44)
AST: 31 U/L (ref 15–41)
Albumin: 4.5 g/dL (ref 3.5–5.0)
Alkaline Phosphatase: 72 U/L (ref 38–126)
Anion gap: 10 (ref 5–15)
BUN: 9 mg/dL (ref 6–20)
CO2: 24 mmol/L (ref 22–32)
Calcium: 9.8 mg/dL (ref 8.9–10.3)
Chloride: 106 mmol/L (ref 98–111)
Creatinine, Ser: 0.67 mg/dL (ref 0.44–1.00)
GFR calc Af Amer: >60 mL/min (ref >60)
GFR calc non Af Amer: >60 mL/min (ref >60)
Glucose, Bld: 106 mg/dL — ABNORMAL HIGH (ref 70–99)
Potassium: 3.8 mmol/L (ref 3.5–5.1)
Sodium: 140 mmol/L (ref 135–145)
Total Bilirubin: 0.8 mg/dL (ref 0.3–1.2)
Total Protein: 8.2 g/dL — ABNORMAL HIGH (ref 6.5–8.1)

## 2019-06-08 LAB — LIPASE, BLOOD: Lipase: 27 U/L (ref 11–51)

## 2019-06-08 MED ORDER — SODIUM CHLORIDE 0.9% FLUSH: 3.0000 mL | Freq: Once | INTRAVENOUS | Status: DC

## 2019-06-08 MED ORDER — ONDANSETRON HCL 4 MG/2ML IJ SOLN
4.0000 mg | Freq: Once | INTRAMUSCULAR | Status: AC
  Administered 2019-06-08: 21:00:00 4 mg via INTRAVENOUS
  Filled 2019-06-08: qty 2

## 2019-06-08 NOTE — ED Triage Notes (Signed)
PT c/o abd pain with nausea, body aches, headache and cough xfew days. Denies fever. 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

## 2019-06-08 NOTE — ED Notes (Signed)
Patient transported to CT 

## 2019-06-08 NOTE — ED Provider Notes (Signed)
[facility] Emergency Department Provider Note   ____________________________________________    I have reviewed the triage vital signs and the nursing notes.   HISTORY  Chief Complaint Abdominal Pain and Cough     HPI Kelly Ellis is a 48 y.o. female with a history of diabetes, chronic kidney disease who presents with complaints of abdominal pain.  Patient describes upper abdominal pain and left-sided abdominal pain which is been ongoing for approximately 2 to 3 days.  She describes some cramping as well.  Has not had a bowel movement in 3 days but does not a typical for her.  Does not remember having any flatus.  Positive nausea, decreased p.o. intake.  Does have a history of total hysterectomy and hernia repair.  Does report mild cough  Past Medical History:  Diagnosis Date  . Allergy   . Anemia   . Asthma   . Blood transfusion without reported diagnosis   . Chronic kidney disease   . Diabetes mellitus without complication (HCC)   . GERD (gastroesophageal reflux disease)   . Hyperlipidemia   . Thyroid disease     Patient Active Problem List   Diagnosis Date Noted  . Rectal bleeding 09/30/2018  . History of colonic polyps 09/30/2018    Past Surgical History:  Procedure Laterality Date  . ABDOMINAL HYSTERECTOMY    . FRACTURE SURGERY    . GALLBLADDER SURGERY    . HERNIA REPAIR      Prior to Admission medications   Medication Sig Start Date End Date Taking? Authorizing Provider  amitriptyline (ELAVIL) 25 MG tablet Take 25 mg by mouth at bedtime.    [provider]  Butalbital-APAP-Caffeine (FIORICET PO) Take by mouth as needed.    [provider]  cephALEXin (KEFLEX) 500 MG capsule Take 1 capsule (500 mg total) by mouth 4 (four) times daily. 03/29/19   Hedges, Tinnie Gens, PA-C  cetirizine (ZYRTEC) 10 MG tablet Take 10 mg by mouth daily.    [provider]  estradiol-levonorgestrel [facility]) 0.045-0.015 MG/DAY  Place 1 patch onto the skin once a week.    [provider]  famotidine (PEPCID) 20 MG tablet Take 20 mg by mouth 2 (two) times daily.    [provider]  fexofenadine (ALLEGRA ALLERGY) 180 MG tablet Take 180 mg by mouth daily.    [provider]  fluticasone furoate-vilanterol (BREO ELLIPTA) 100-25 MCG/INH AEPB Inhale 1 puff into the lungs daily.    [provider]  montelukast (SINGULAIR) 10 MG tablet Take 10 mg by mouth at bedtime.    [provider]  omeprazole (PRILOSEC) 20 MG capsule Take 20 mg by mouth daily.    [provider]  tobramycin (TOBREX) 0.3 % ophthalmic solution Place 1 drop into the right eye every 6 (six) hours. 10/16/18   Mardella Layman, MD  tolnaftate (TINACTIN) 1 % cream Apply 1 application topically 2 (two) times daily. 03/28/19   Elson Areas, PA-C  traMADol (ULTRAM) 50 MG tablet Take 1 tablet (50 mg total) by mouth every 6 (six) hours as needed. 03/29/19   Hedges, Tinnie Gens, PA-C  umeclidinium bromide (INCRUSE ELLIPTA) 62.5 MCG/INH AEPB Inhale 1 puff into the lungs daily.    [provider]     Allergies Contrast media [iodinated diagnostic agents], Erythromycin, Flagyl [metronidazole], Morphine and related, and Reglan [metoclopramide]  Family History  Problem Relation Age of Onset  . Heart disease Mother   . Hyperlipidemia Mother   . Cancer Father   .  Diabetes Father   . Heart disease Father   . Hyperlipidemia Father   . Stroke Father     Social History Social History   Tobacco Use  . Smoking status: Former Games developermoker  . Smokeless tobacco: Never Used  Substance Use Topics  . Alcohol use: Not Currently  . Drug use: Not Currently    Review of Systems  Constitutional: No fever/chills Eyes: No visual changes.  ENT: No sore throat. Cardiovascular: Denies chest pain. Respiratory: Denies shortness of breath. Gastrointestinal: No abdominal pain.  No nausea, no vomiting.   Genitourinary: Negative  for dysuria. Musculoskeletal: Negative for back pain. Skin: Negative for rash. Neurological: Negative for headaches or weakness   ____________________________________________   PHYSICAL EXAM:  VITAL SIGNS: ED Triage Vitals  Enc Vitals Group     BP 06/08/19 1833 134/85     Pulse Rate 06/08/19 1833 87     Resp 06/08/19 1833 16     Temp 06/08/19 1833 98.6 F (37 C)     Temp Source 06/08/19 1833 Oral     SpO2 06/08/19 1833 99 %     Weight 06/08/19 1834 77.1 kg (170 lb)     Height 06/08/19 1834 1.499 m (4\' 11" )     Head Circumference --      Peak Flow --      Pain Score 06/08/19 1834 4     Pain Loc --      Pain Edu? --      Excl. in GC? --     Constitutional: Alert and oriented.  Nose: No congestion/rhinnorhea. Mouth/Throat: Mucous membranes are moist.    Cardiovascular: Normal rate, regular rhythm. Peri [place].  Good peripheral circulation. Respiratory: Normal respiratory effort.  No retractions. Gastrointestinal: Mild tenderness to the left lower quadrant left upper quadrant, mild distention Genitourinary: deferred Musculoskeletal: No lower extremity tenderness nor edema.  Warm and well perfused Neurologic:  Normal speech and language. No gross focal neurologic deficits are appreciated.  Skin:  Skin is warm, dry and intact. No rash noted. Psychiatric: Mood and affect are normal. Speech and behavior are normal.  ____________________________________________   LABS (all labs ordered are listed, but only abnormal results are displayed)  Labs Reviewed  COMPREHENSIVE METABOLIC PANEL - Abnormal; Notable for the following components:      Result Value   Glucose, Bld 106 (*)    Total Protein 8.2 (*)    ALT 49 (*)    All other components within normal limits  CBC - Abnormal; Notable for the following components:   RBC 5.31 (*)    All other components within normal limits  LIPASE, BLOOD  URINALYSIS, COMPLETE (UACMP) WITH MICROSCOPIC   ____________________________________________   EKG  None ____________________________________________  RADIOLOGY  CT abdomen pelvis ____________________________________________   PROCEDURES  Procedure(s) performed: No  Procedures   Critical Care performed: No ____________________________________________   INITIAL IMPRESSION / ASSESSMENT AND PLAN / ED COURSE  Pertinent labs & imaging results that were available during my care of the patient were reviewed by me and considered in my medical decision making (see chart for details).  Patient presents with abdominal pain as described above, mild tenderness particularly in the left lower quadrant left upper quadrant suspicious for diverticulitis.  Also on the differential is SBO versus colitis.  We will obtain CT abdomen pelvis and reevaluate  CT scan is quite reassuring.  On reexam patient reports he is feeling better.  Appropriate for discharge at this point with outpatient follow-up, return precautions discussed  ____________________________________________   FINAL CLINICAL IMPRESSION(S) / ED DIAGNOSES  Final diagnoses:  Generalized abdominal pain        Note:  This document was prepared using Dragon voice recognition software and may include unintentional dictation errors.   Lavonia Drafts, MD 06/08/19 2255

## 2020-01-11 IMAGING — CT CT ABDOMEN AND PELVIS WITHOUT CONTRAST
2 of 4 series · 17 of 46 positions shown, 19 images · non-contrast
Comparison: None.

CLINICAL DATA: Nausea, body aches, headaches, cough

EXAM:
CT ABDOMEN AND PELVIS WITHOUT CONTRAST
TECHNIQUE: Multidetector CT imaging of the abdomen and pelvis was performed
following the standard protocol without IV contrast.

[Series 2: routine abd/pel wo · axial · 0.84mm/px · z∈[-1020,-605]mm · 14 of 91 slices shown, 16 images]
[im 4/91  soft-tissue]
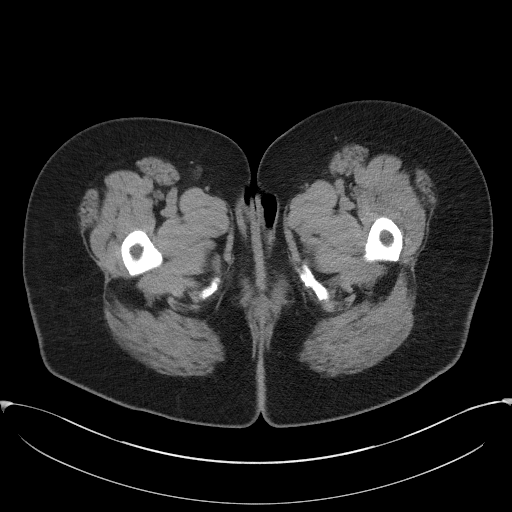
[im 4/91  bone]
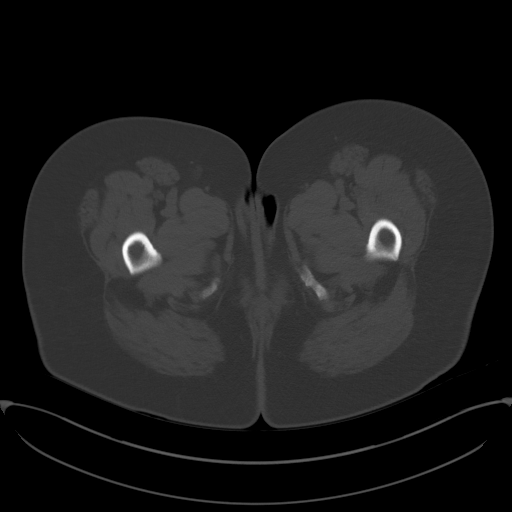
[im 12/91  soft-tissue]
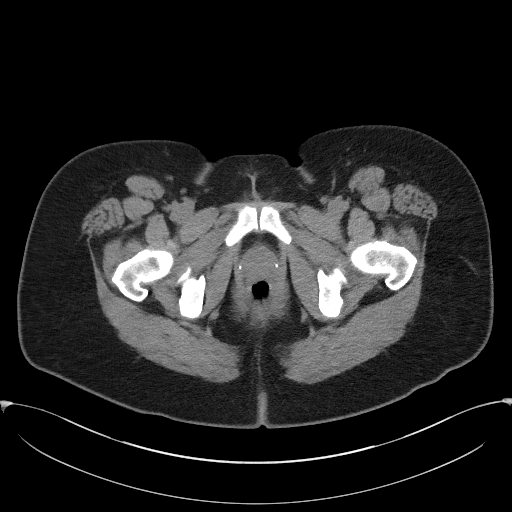
[im 16/91  soft-tissue]
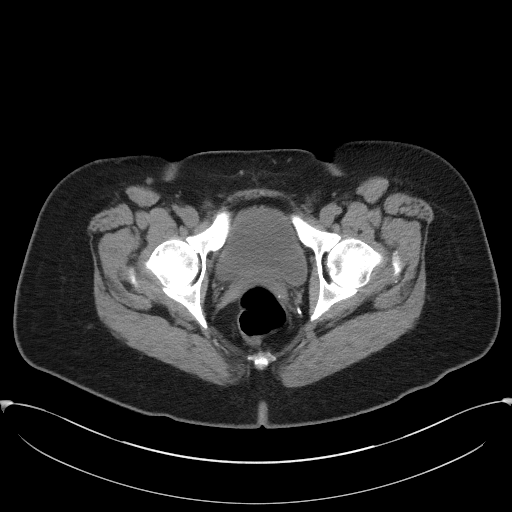
[im 24/91  soft-tissue]
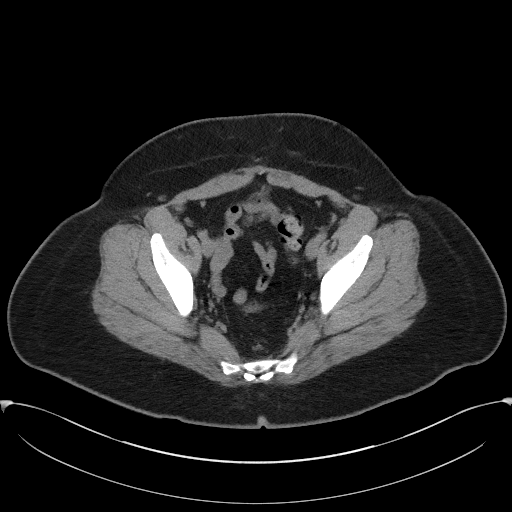
[im 32/91  soft-tissue]
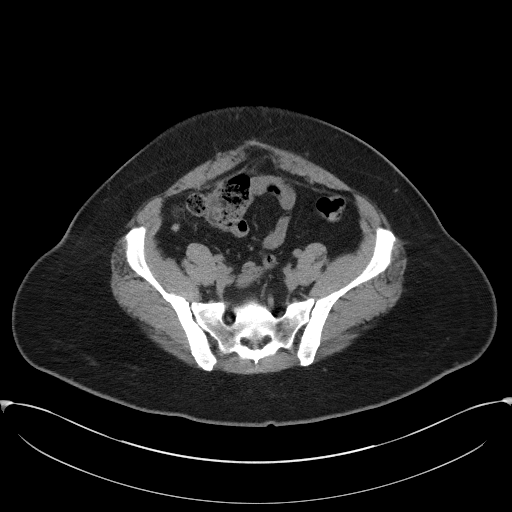
[im 36/91  soft-tissue]
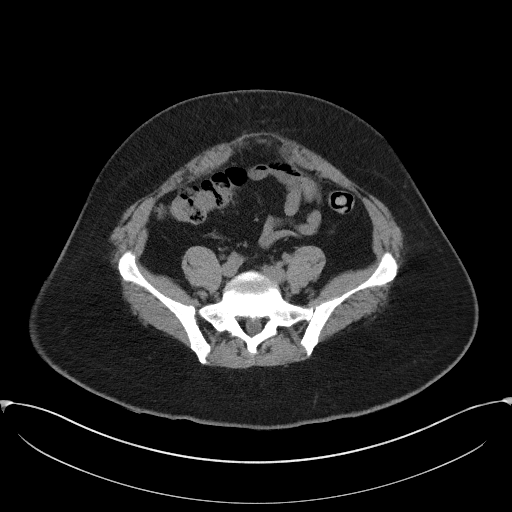
[im 44/91  soft-tissue]
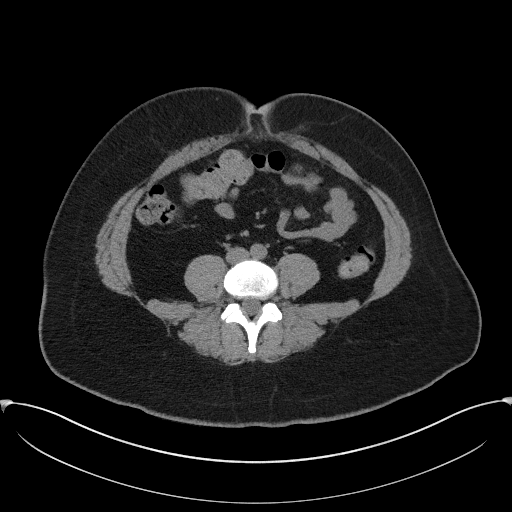
[im 47/91  soft-tissue]
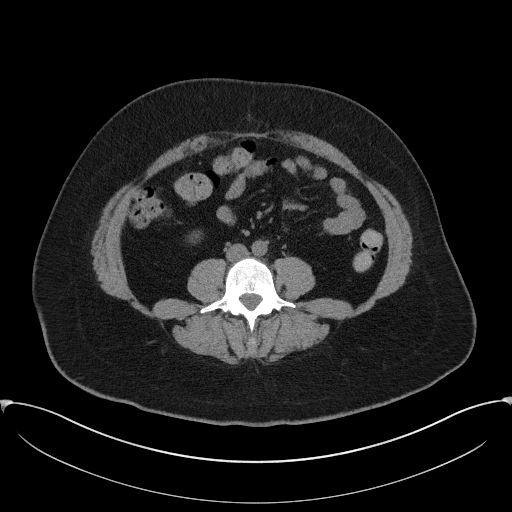
[im 55/91  soft-tissue]
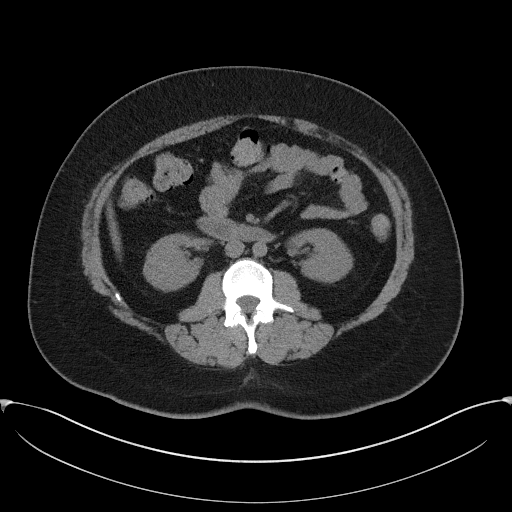
[im 55/91  bone]
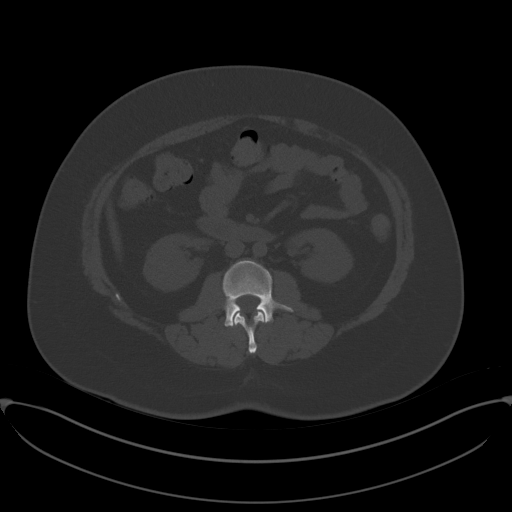
[im 59/91  soft-tissue]
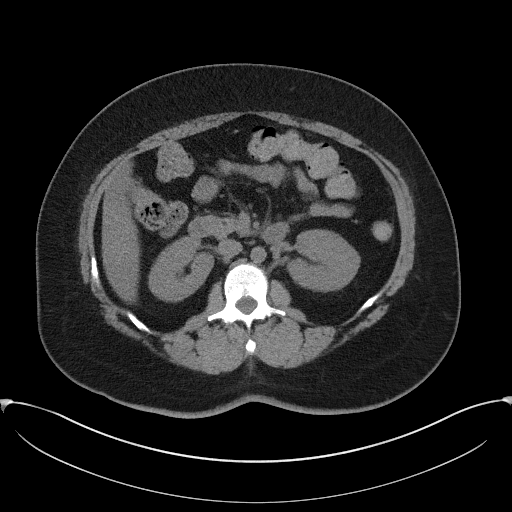
[im 67/91  soft-tissue]
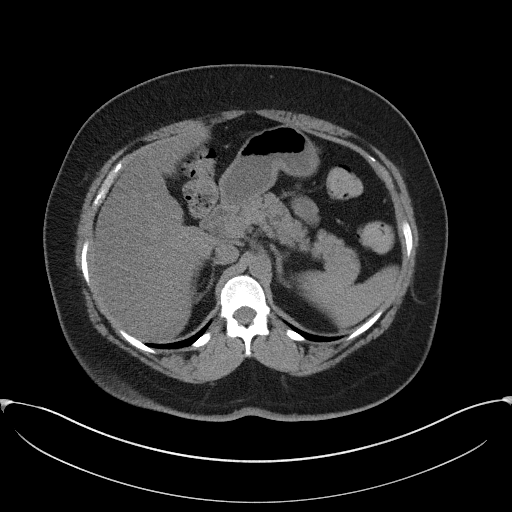
[im 75/91  soft-tissue]
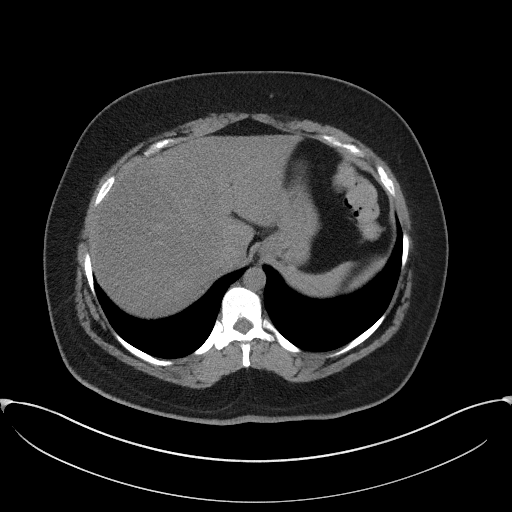
[im 79/91  soft-tissue]
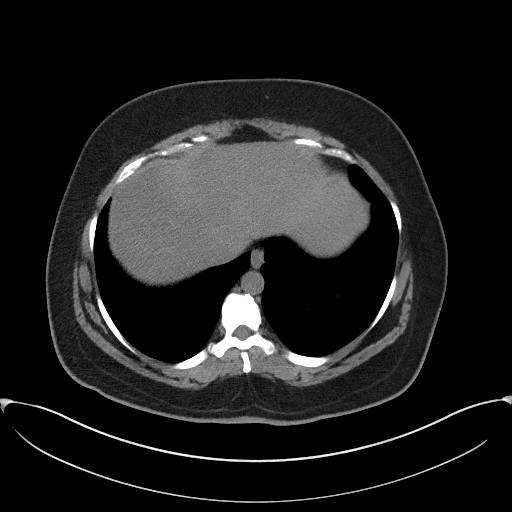
[im 87/91  soft-tissue]
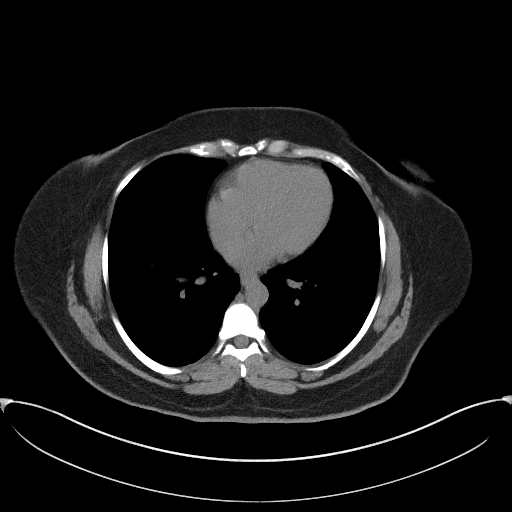

[Series 5: coronal st · coronal · 0.83mm/px · 3 of 92 slices shown]
[im 31/92  soft-tissue]
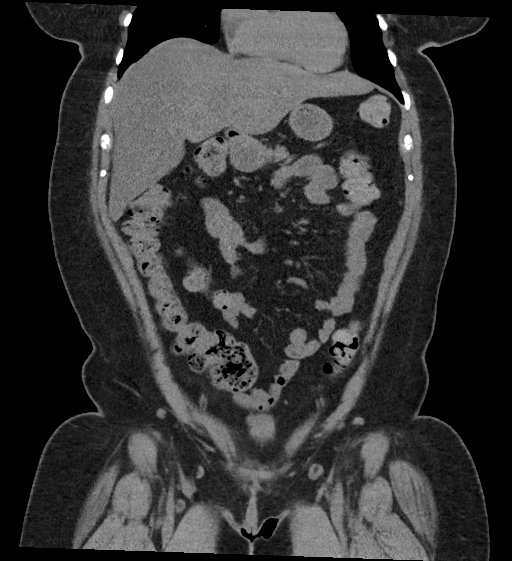
[im 41/92  soft-tissue]
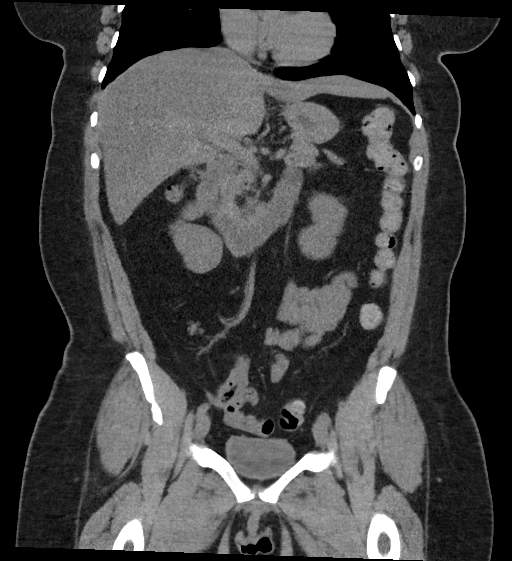
[im 51/92  soft-tissue]
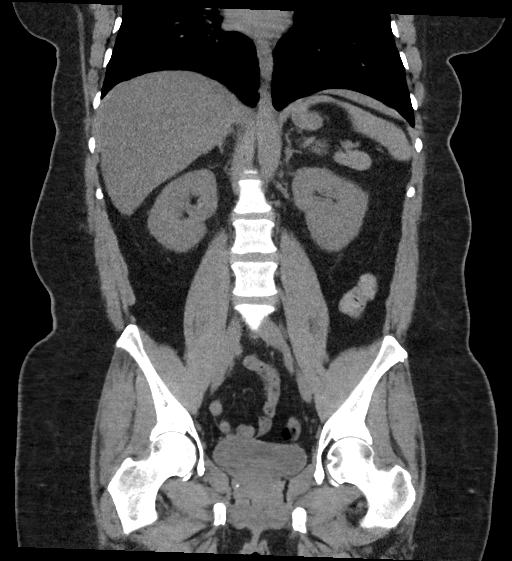

[17 of 46 positions shown; findings below may reference images not displayed]

FINDINGS: Lower chest: Lung bases are clear. No effusions. Heart is normal
size.

Hepatobiliary: Diffuse low-density throughout the liver compatible
with fatty infiltration. No focal abnormality. Prior
cholecystectomy.

Pancreas: No focal abnormality or ductal dilatation.

Spleen: No focal abnormality.  Normal size.

Adrenals/Urinary Tract: No adrenal abnormality. No focal renal
abnormality. No stones or hydronephrosis. Urinary bladder is
unremarkable.

Stomach/Bowel: Normal appendix. Stomach, large and small bowel
grossly unremarkable.

Vascular/Lymphatic: No evidence of aneurysm or adenopathy.

Reproductive: Prior hysterectomy.  No adnexal masses.

Other: No free fluid or free air.

Musculoskeletal: No acute bony abnormality.
IMPRESSION: Diffuse fatty infiltration of the liver.

Prior cholecystectomy and hysterectomy.

No acute findings.
